# Patient Record
Sex: Female | Born: 1980 | Race: Black or African American | Hispanic: No | Marital: Single
Health system: Southern US, Community
[De-identification: ages and names within clinical notes are randomized; demographics above are authoritative.]

---

## 2017-04-03 ENCOUNTER — Emergency Department (HOSPITAL_COMMUNITY)
Admission: EM | Admit: 2017-04-03 | Discharge: 2017-04-03 | Disposition: A | Discharge status: Home / Self Care | Payer: Self-pay | Attending: Emergency Medicine | Admitting: Emergency Medicine

## 2017-04-03 ENCOUNTER — Encounter (HOSPITAL_COMMUNITY): Payer: Self-pay | Admitting: Emergency Medicine

## 2017-04-03 DIAGNOSIS — Z5321 Procedure and treatment not carried out due to patient leaving prior to being seen by health care provider: Secondary | ICD-10-CM | POA: Insufficient documentation

## 2017-04-03 DIAGNOSIS — S61411A Laceration without foreign body of right hand, initial encounter: Secondary | ICD-10-CM

## 2017-04-03 DIAGNOSIS — M79642 Pain in left hand: Secondary | ICD-10-CM | POA: Insufficient documentation

## 2017-04-03 MED ORDER — CEPHALEXIN 500 MG PO CAPS
500.0000 mg | ORAL_CAPSULE | Freq: Once | ORAL | Status: DC
  Filled 2017-04-03: qty 1
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
## 2017-04-03 NOTE — ED Notes (Signed)
RN went into room to administer medication and patient was no longer in room. PA notified.

## 2017-04-14 ENCOUNTER — Emergency Department (HOSPITAL_COMMUNITY): Payer: Self-pay

## 2017-04-14 ENCOUNTER — Encounter (HOSPITAL_COMMUNITY): Payer: Self-pay

## 2017-04-14 ENCOUNTER — Emergency Department (HOSPITAL_COMMUNITY)
Admission: EM | Admit: 2017-04-14 | Discharge: 2017-04-14 | Disposition: A | Discharge status: Home / Self Care | Payer: Self-pay | Attending: Emergency Medicine | Admitting: Emergency Medicine

## 2017-04-14 DIAGNOSIS — Y929 Unspecified place or not applicable: Secondary | ICD-10-CM | POA: Insufficient documentation

## 2017-04-14 DIAGNOSIS — Y998 Other external cause status: Secondary | ICD-10-CM | POA: Insufficient documentation

## 2017-04-14 DIAGNOSIS — S022XXA Fracture of nasal bones, initial encounter for closed fracture: Secondary | ICD-10-CM | POA: Insufficient documentation

## 2017-04-14 DIAGNOSIS — T07XXXA Unspecified multiple injuries, initial encounter: Secondary | ICD-10-CM | POA: Insufficient documentation

## 2017-04-14 DIAGNOSIS — Y9389 Activity, other specified: Secondary | ICD-10-CM | POA: Insufficient documentation

## 2017-04-14 DIAGNOSIS — Z79899 Other long term (current) drug therapy: Secondary | ICD-10-CM | POA: Insufficient documentation

## 2017-04-14 MED ORDER — HYDROCODONE-ACETAMINOPHEN 5-325 MG PO TABS: 1.0000 | ORAL_TABLET | ORAL | 0 refills | Status: AC | PRN

## 2017-04-14 MED ORDER — HYDROCODONE-ACETAMINOPHEN 5-325 MG PO TABS
2.0000 | ORAL_TABLET | Freq: Once | ORAL | Status: AC
  Administered 2017-04-14: 2 via ORAL
  Filled 2017-04-14: qty 2
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
## 2017-04-14 NOTE — Discharge Instructions (Signed)
Return if any problems.
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
## 2017-04-17 ENCOUNTER — Emergency Department (HOSPITAL_COMMUNITY): Payer: Self-pay

## 2017-04-17 ENCOUNTER — Emergency Department (HOSPITAL_COMMUNITY)
Admission: EM | Admit: 2017-04-17 | Discharge: 2017-04-17 | Disposition: A | Discharge status: Home / Self Care | Payer: Self-pay | Attending: Emergency Medicine | Admitting: Emergency Medicine

## 2017-04-17 ENCOUNTER — Other Ambulatory Visit: Payer: Self-pay

## 2017-04-17 ENCOUNTER — Encounter (HOSPITAL_COMMUNITY): Payer: Self-pay

## 2017-04-17 DIAGNOSIS — R6884 Jaw pain: Secondary | ICD-10-CM | POA: Insufficient documentation

## 2017-04-17 DIAGNOSIS — Z79899 Other long term (current) drug therapy: Secondary | ICD-10-CM | POA: Insufficient documentation

## 2017-04-17 DIAGNOSIS — R0789 Other chest pain: Secondary | ICD-10-CM | POA: Insufficient documentation

## 2017-04-17 DIAGNOSIS — M79642 Pain in left hand: Secondary | ICD-10-CM | POA: Insufficient documentation

## 2017-04-17 LAB — BASIC METABOLIC PANEL
Anion gap: 9 (ref 5–15)
BUN: 13 mg/dL (ref 6–20)
CALCIUM: 8.9 mg/dL (ref 8.9–10.3)
CO2: 24 mmol/L (ref 22–32)
CREATININE: 0.91 mg/dL (ref 0.44–1.00)
Chloride: 106 mmol/L (ref 101–111)
GFR calc non Af Amer: >60 mL/min (ref >60)
GLUCOSE: 98 mg/dL (ref 65–99)
Potassium: 4.2 mmol/L (ref 3.5–5.1)
Sodium: 139 mmol/L (ref 135–145)

## 2017-04-17 LAB — CBC
HCT: 38.8 % (ref 36.0–46.0)
Hemoglobin: 13.4 g/dL (ref 12.0–15.0)
MCH: 29.6 pg (ref 26.0–34.0)
MCHC: 34.5 g/dL (ref 30.0–36.0)
MCV: 85.7 fL (ref 78.0–100.0)
PLATELETS: 347 10*3/uL (ref 150–400)
RBC: 4.53 MIL/uL (ref 3.87–5.11)
RDW: 13.2 % (ref 11.5–15.5)
WBC: 8.2 10*3/uL (ref 4.0–10.5)

## 2017-04-17 LAB — I-STAT TROPONIN, ED: Troponin i, poc: 0 ng/mL (ref 0.00–0.08)

## 2017-04-17 LAB — I-STAT BETA HCG BLOOD, ED (MC, WL, AP ONLY): I-stat hCG, quantitative: <5 m[IU]/mL (ref <5)

## 2017-04-17 MED ORDER — OXYCODONE-ACETAMINOPHEN 5-325 MG PO TABS
2.0000 | ORAL_TABLET | Freq: Once | ORAL | Status: AC
  Administered 2017-04-17: 2 via ORAL
  Filled 2017-04-17: qty 2

## 2017-04-17 MED ORDER — OXYCODONE-ACETAMINOPHEN 5-325 MG PO TABS: 1.0000 | ORAL_TABLET | ORAL | 0 refills | Status: AC | PRN
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
## 2017-04-17 NOTE — ED Notes (Signed)
Pt to ct 
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
## 2017-04-17 NOTE — ED Notes (Signed)
Pt scanned by wow and the med was scanned  But the epic computer locked up and did not allow med to show given
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
## 2017-04-17 NOTE — ED Notes (Signed)
Pt was given percocet x 2 po

## 2017-04-17 NOTE — ED Notes (Signed)
Pt back to x ray
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
